# Patient Record
Sex: Female | Born: 1950 | Race: Black or African American | Hispanic: No | Marital: Married | Smoking: Never smoker
Health system: Southern US, Community
[De-identification: ages and names within clinical notes are randomized; demographics above are authoritative.]

## PROBLEM LIST (undated history)

## (undated) ENCOUNTER — Emergency Department: Disposition: A | Discharge status: Home / Self Care | Payer: Medicare Other | Source: Home / Self Care

---

## 1999-07-24 ENCOUNTER — Encounter (INDEPENDENT_AMBULATORY_CARE_PROVIDER_SITE_OTHER): Payer: Self-pay

## 1999-07-24 ENCOUNTER — Other Ambulatory Visit: Admission: RE | Admit: 1999-07-24 | Discharge: 1999-07-24 | Payer: Self-pay | Admitting: Obstetrics and Gynecology

## 2001-09-23 ENCOUNTER — Encounter: Payer: Self-pay | Admitting: Nephrology

## 2001-09-23 ENCOUNTER — Encounter: Admission: RE | Admit: 2001-09-23 | Discharge: 2001-09-23 | Payer: Self-pay | Admitting: Nephrology

## 2001-10-19 ENCOUNTER — Ambulatory Visit (HOSPITAL_COMMUNITY): Admission: RE | Admit: 2001-10-19 | Discharge: 2001-10-19 | Payer: Self-pay | Admitting: Cardiology

## 2001-10-19 ENCOUNTER — Encounter (INDEPENDENT_AMBULATORY_CARE_PROVIDER_SITE_OTHER): Payer: Self-pay | Admitting: *Deleted

## 2001-10-19 ENCOUNTER — Encounter: Payer: Self-pay | Admitting: Nephrology

## 2003-05-22 ENCOUNTER — Ambulatory Visit (HOSPITAL_COMMUNITY): Admission: RE | Admit: 2003-05-22 | Discharge: 2003-05-22 | Payer: Self-pay

## 2003-11-05 ENCOUNTER — Encounter: Admission: RE | Admit: 2003-11-05 | Discharge: 2003-11-05 | Payer: Self-pay | Admitting: Nephrology

## 2003-11-26 ENCOUNTER — Encounter (INDEPENDENT_AMBULATORY_CARE_PROVIDER_SITE_OTHER): Payer: Self-pay | Admitting: Specialist

## 2003-11-26 ENCOUNTER — Ambulatory Visit (HOSPITAL_COMMUNITY): Admission: RE | Admit: 2003-11-26 | Discharge: 2003-11-26 | Payer: Self-pay | Admitting: Gastroenterology

## 2005-05-14 IMAGING — CR DG HIP COMPLETE 2+V*R*
3 series · 3 of 3 positions shown · non-contrast
Comparison: none

CLINICAL DATA: Back and right hip pain. 
 LUMBAR SPINE COMPLETE AND RIGHT HIP COMPLETE 
 RIGHT HIP
 AP view of the pelvis with AP and frog-leg coned-down views of the right hip. 
 No fractures or acute changes.  No degenerative changes of the hips.  Mild degenerative changes of the SI joints. 
 IMPRESSION
 1.  The right hip is normal. 
 2.  There are some degenerative changes of the SI joint. 
 LUMBAR SPINE 
 There is a transitional lumbosacral junction with asymmetrical sacralization on the left side.  No significant disc narrowing.  Mild degenerative changes of the facets.  Soft tissues normal.
 Transitional lumbosacral junction and mild degenerative changes ? no acute abnormality.

[view not recorded (1 of 3)]
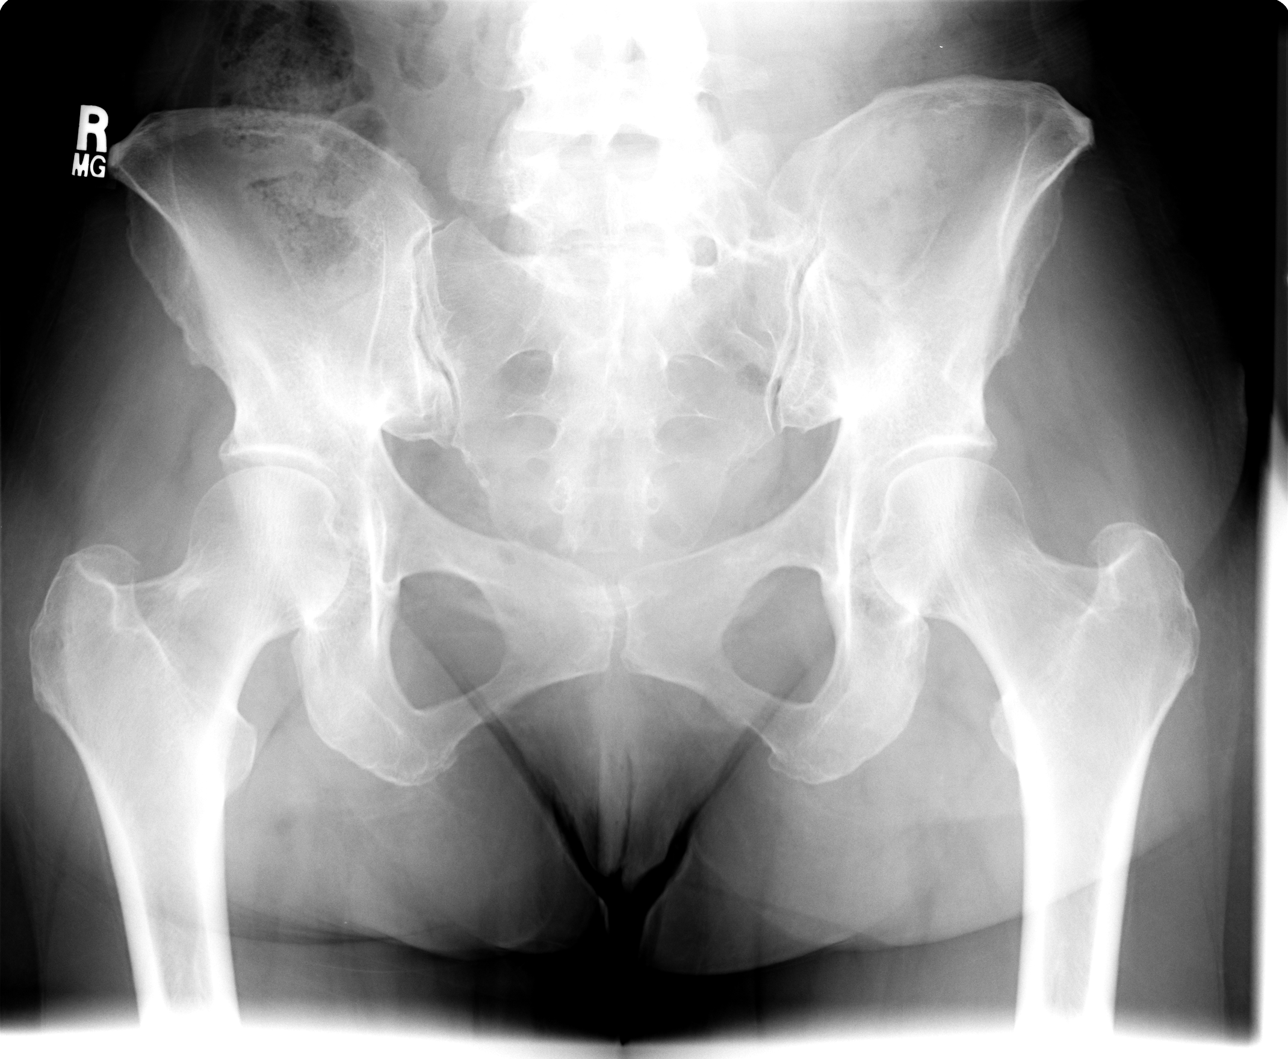

[view not recorded (2 of 3)]
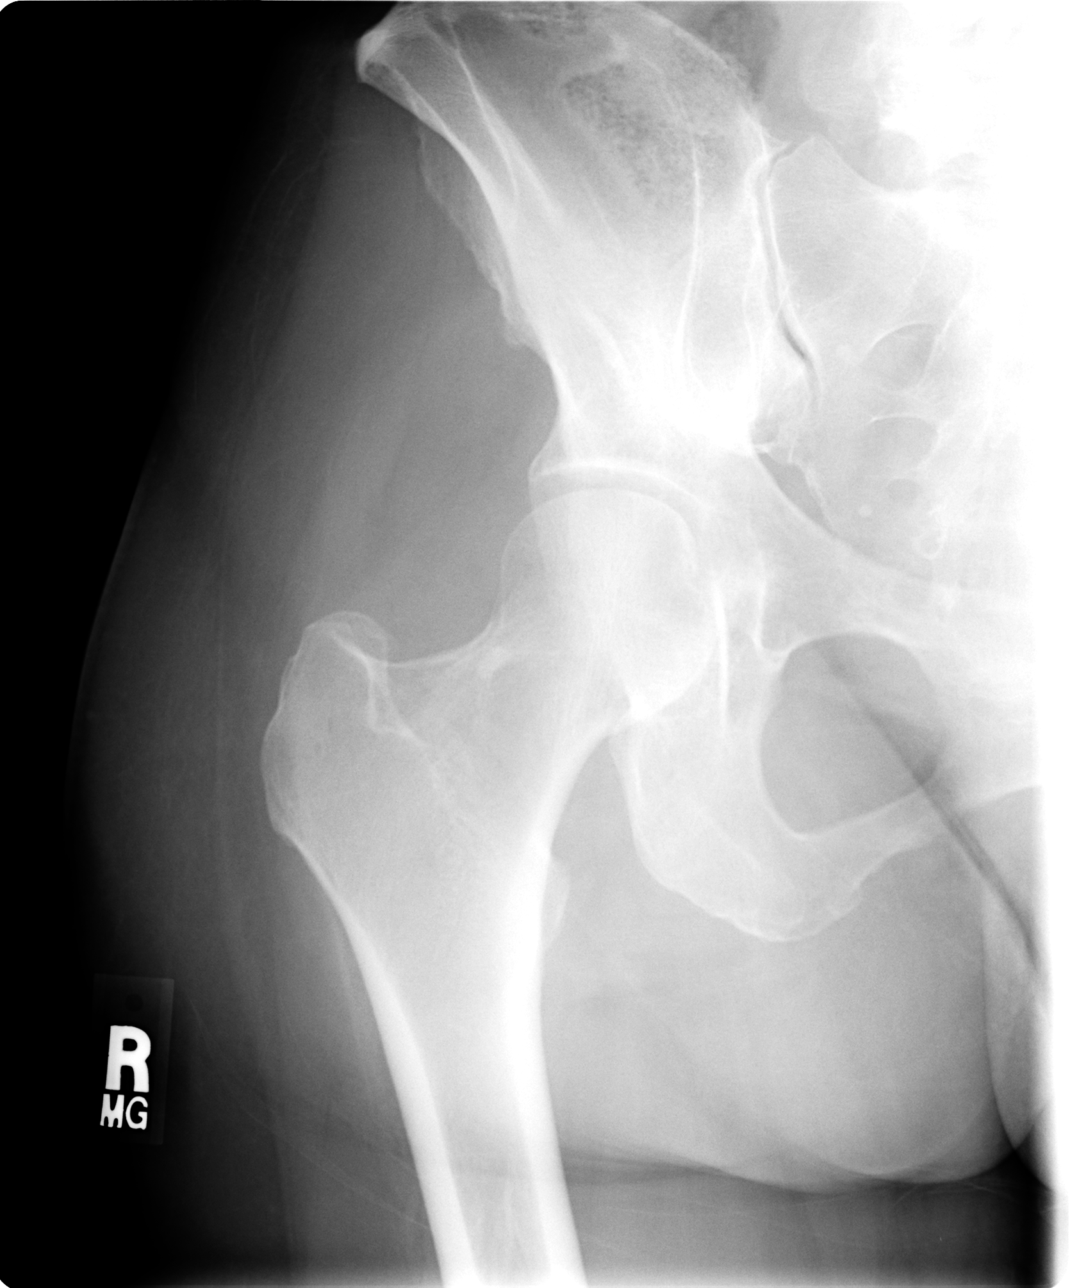

[view not recorded (3 of 3)]
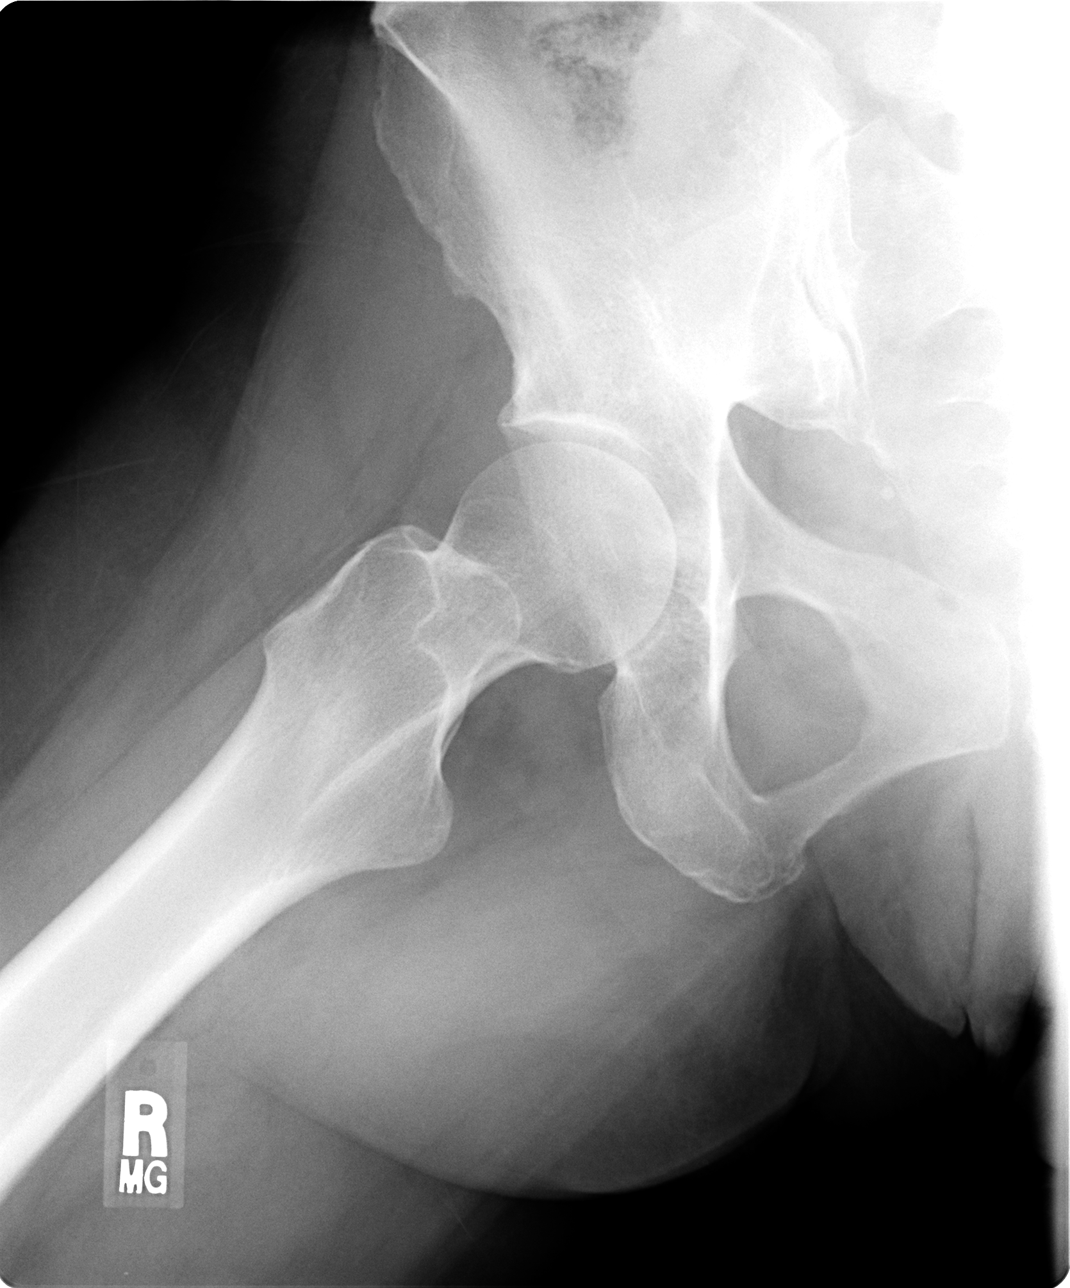

[3 of 3 positions shown; findings below may reference images not displayed]

## 2007-10-24 ENCOUNTER — Encounter: Admission: RE | Admit: 2007-10-24 | Discharge: 2007-10-24 | Payer: Self-pay | Admitting: Otolaryngology

## 2010-10-31 NOTE — Op Note (Signed)
NAME:  Autumn Ramirez, Autumn Ramirez                        ACCOUNT NO.:  [id number]   MEDICAL RECORD NO.:  [id number]                   PATIENT TYPE:  AMB   LOCATION:  ENDO                                 FACILITY:  [facility]   PHYSICIAN:  James L. Malon Kindle., M.D.          DATE OF BIRTH:  Oct 20, 1950   DATE OF PROCEDURE:  11/26/2003  DATE OF DISCHARGE:                                 OPERATIVE REPORT   PROCEDURE:  Colonoscopy and polypectomy.   MEDICATIONS:  1. Fentanyl 100 mcg.  2. Versed 8 mg IV.   SCOPE:  Olympus pediatric adjustable colonoscope.   DESCRIPTION OF PROCEDURE:  The procedure had been explained to the patient  and consent obtained.  With the patient in the left lateral decubitus  position, the Olympus scope was inserted and advanced.  The pediatric  adjustable scope was used.  We were able to advance easily to the cecum.  Ileocecal valve and appendiceal orifice were seen.  The scope was withdrawn,  and the cecum, ascending colon, transverse colon, splenic flexure,  descending, and sigmoid colon were seen well.  At 30-35 cm from the anal  verge, a 1.5 cm pedunculated polyp was encountered.  This was removed with  the snare and sucked up against the scope and removed.  I reinserted the  scope back up to that site and withdrew.  No other polyps were seen.  There  was no significant diverticular disease.  The scope was withdrawn.  The  patient tolerated the procedure well.   ASSESSMENT:  Sigmoid colon polyp, removed.  211.3.   PLAN:  1. Routine postpolypectomy instructions.  2. We will recommend repeating in 3 years.                                               James L. Malon Kindle., M.D.    Waldron Session  D:  11/26/2003  T:  11/26/2003  Job:  [id number]   cc:   Jarome Matin, M.D.  [address] [place]  [place] [address]  Fax: [phone]   Lenoard Aden, M.D.  [address]  [place]  [place] [address]  Fax: [phone]

## 2014-12-23 ENCOUNTER — Ambulatory Visit (INDEPENDENT_AMBULATORY_CARE_PROVIDER_SITE_OTHER): Payer: Medicare HMO | Admitting: Emergency Medicine

## 2014-12-23 VITALS — BP 130/80 | HR 63 | Temp 98.3°F | Resp 16 | Ht 65.5 in | Wt 143.2 lb

## 2014-12-23 DIAGNOSIS — S70361A Insect bite (nonvenomous), right thigh, initial encounter: Secondary | ICD-10-CM

## 2014-12-23 DIAGNOSIS — T63481A Toxic effect of venom of other arthropod, accidental (unintentional), initial encounter: Secondary | ICD-10-CM

## 2014-12-23 MED ORDER — TRIAMCINOLONE ACETONIDE 0.1 % EX CREA: 1.0000 "application " | TOPICAL_CREAM | Freq: Two times a day (BID) | CUTANEOUS | Status: AC

## 2014-12-23 MED ORDER — SULFAMETHOXAZOLE-TRIMETHOPRIM 800-160 MG PO TABS: 1.0000 | ORAL_TABLET | Freq: Two times a day (BID) | ORAL | Status: AC

## 2014-12-23 NOTE — Progress Notes (Signed)
Subjective:  Patient ID: Autumn Ramirez, female    DOB: [birth year]  Age: 64 y.o. MRN: [id number]  CC: Insect Bite and Rash   HPI [name] presents  with a 2 day history of redness and swelling in the medial thigh. His bitten by a an insect 2 days ago and has increasing redness swelling and itching in her right medial thigh. She has no shortness breath wheezing or difficulty swallowing or breathing. Has no cough or coryza. No fever or chills. No nausea vomiting. No known insect allergy. She is not improved on over-the-counter medication  History [name] has no past medical history on file.   She has no past surgical history on file.   Her  family history is not on file.  She   reports that she has never smoked. She does not have any smokeless tobacco history on file. Her alcohol and drug histories are not on file.  No outpatient prescriptions prior to visit.   No facility-administered medications prior to visit.    History   Social History  . Marital Status: Married    Spouse Name: N/A  . Number of Children: N/A  . Years of Education: N/A   Social History Main Topics  . Smoking status: Never Smoker   . Smokeless tobacco: Not on file  . Alcohol Use: Not on file  . Drug Use: Not on file  . Sexual Activity: Not on file   Other Topics Concern  . None   Social History Narrative  . None     Review of Systems  Constitutional: Negative for fever, chills and appetite change.  HENT: Negative for congestion, ear pain, postnasal drip, sinus pressure and sore throat.   Eyes: Negative for pain and redness.  Respiratory: Negative for cough, shortness of breath and wheezing.   Cardiovascular: Negative for leg swelling.  Gastrointestinal: Negative for nausea, vomiting, abdominal pain, diarrhea, constipation and blood in stool.  Endocrine: Negative for polyuria.  Genitourinary: Negative for dysuria, urgency, frequency and flank pain.  Musculoskeletal: Negative for  gait problem.  Skin: Negative for rash.  Neurological: Negative for weakness and headaches.  Psychiatric/Behavioral: Negative for confusion and decreased concentration. The patient is not nervous/anxious.     Objective:  BP 130/80 mmHg  Pulse 63  Temp(Src) 98.3 F (36.8 C) (Oral)  Resp 16  Ht 5' 5.5" (1.664 m)  Wt 143 lb 3.2 oz (64.955 kg)  BMI 23.46 kg/m2  SpO2 97%  Physical Exam  Constitutional: She is oriented to person, place, and time. She appears well-developed and well-nourished. No distress.  HENT:  Head: Normocephalic and atraumatic.  Right Ear: External ear normal.  Left Ear: External ear normal.  Nose: Nose normal.  Eyes: Conjunctivae and EOM are normal. Pupils are equal, round, and reactive to light. No scleral icterus.  Neck: Normal range of motion. Neck supple. No tracheal deviation present.  Cardiovascular: Normal rate, regular rhythm and normal heart sounds.   Pulmonary/Chest: Effort normal. No respiratory distress. She has no wheezes. She has no rales.  Abdominal: She exhibits no mass. There is no tenderness. There is no rebound and no guarding.  Musculoskeletal: She exhibits no edema.  Lymphadenopathy:    She has no cervical adenopathy.  Neurological: She is alert and oriented to person, place, and time. Coordination normal.  Skin: Skin is warm and dry. No rash noted.  Psychiatric: She has a normal mood and affect. Her behavior is normal.   she has an erythematous area proximally  6 inches diameter right medial distal thigh. There is no lymphangitis or lymphadenopathy    Assessment & Plan:   Claris CheMargaret was seen today for insect bite and rash.  Diagnoses and all orders for this visit:  Insect sting, accidental or unintentional, initial encounter  Other orders -     sulfamethoxazole-trimethoprim (BACTRIM DS,SEPTRA DS) 800-160 MG per tablet; Take 1 tablet by mouth 2 (two) times daily. -     triamcinolone cream (KENALOG) 0.1 %; Apply 1 application topically  2 (two) times daily.   I am having Ms. Kobus start on sulfamethoxazole-trimethoprim and triamcinolone cream.  Meds ordered this encounter  Medications  . sulfamethoxazole-trimethoprim (BACTRIM DS,SEPTRA DS) 800-160 MG per tablet    Sig: Take 1 tablet by mouth 2 (two) times daily.    Dispense:  20 tablet    Refill:  0  . triamcinolone cream (KENALOG) 0.1 %    Sig: Apply 1 application topically 2 (two) times daily.    Dispense:  30 g    Refill:  0    Appropriate red flag conditions were discussed with the patient as well as actions that should be taken.  Patient expressed his understanding.  Follow-up: Return if symptoms worsen or fail to improve.  Carmelina DaneAnderson, Fay Swider S, MD

## 2014-12-23 NOTE — Patient Instructions (Signed)

## 2015-08-23 ENCOUNTER — Other Ambulatory Visit: Payer: Self-pay | Admitting: Gastroenterology

## 2015-10-25 ENCOUNTER — Other Ambulatory Visit: Payer: Self-pay | Admitting: Internal Medicine

## 2015-10-25 DIAGNOSIS — E2839 Other primary ovarian failure: Secondary | ICD-10-CM

## 2015-11-13 ENCOUNTER — Ambulatory Visit
Admission: RE | Admit: 2015-11-13 | Discharge: 2015-11-13 | Disposition: A | Discharge status: Home / Self Care | Payer: Medicare HMO | Source: Ambulatory Visit | Attending: Internal Medicine | Admitting: Internal Medicine

## 2015-11-13 DIAGNOSIS — E2839 Other primary ovarian failure: Secondary | ICD-10-CM

## 2018-04-01 ENCOUNTER — Other Ambulatory Visit: Payer: Self-pay | Admitting: Internal Medicine

## 2018-04-01 DIAGNOSIS — E2839 Other primary ovarian failure: Secondary | ICD-10-CM

## 2018-05-24 ENCOUNTER — Ambulatory Visit
Admission: RE | Admit: 2018-05-24 | Discharge: 2018-05-24 | Disposition: A | Discharge status: Home / Self Care | Payer: Medicare HMO | Source: Ambulatory Visit | Attending: Internal Medicine | Admitting: Internal Medicine

## 2018-05-24 DIAGNOSIS — E2839 Other primary ovarian failure: Secondary | ICD-10-CM

## 2019-03-04 ENCOUNTER — Other Ambulatory Visit: Payer: Self-pay

## 2019-03-04 DIAGNOSIS — Z20822 Contact with and (suspected) exposure to covid-19: Secondary | ICD-10-CM

## 2019-03-05 LAB — NOVEL CORONAVIRUS, NAA: SARS-CoV-2, NAA: NOT DETECTED

## 2020-05-31 ENCOUNTER — Other Ambulatory Visit: Payer: Self-pay | Admitting: Internal Medicine

## 2020-05-31 DIAGNOSIS — E2839 Other primary ovarian failure: Secondary | ICD-10-CM

## 2021-04-30 DIAGNOSIS — H35033 Hypertensive retinopathy, bilateral: Secondary | ICD-10-CM | POA: Diagnosis not present

## 2021-04-30 DIAGNOSIS — H2513 Age-related nuclear cataract, bilateral: Secondary | ICD-10-CM | POA: Diagnosis not present

## 2021-04-30 DIAGNOSIS — H04123 Dry eye syndrome of bilateral lacrimal glands: Secondary | ICD-10-CM | POA: Diagnosis not present

## 2021-04-30 DIAGNOSIS — H25013 Cortical age-related cataract, bilateral: Secondary | ICD-10-CM | POA: Diagnosis not present

## 2021-05-29 ENCOUNTER — Other Ambulatory Visit: Payer: Self-pay | Admitting: Internal Medicine

## 2021-05-29 DIAGNOSIS — R7303 Prediabetes: Secondary | ICD-10-CM | POA: Diagnosis not present

## 2021-05-29 DIAGNOSIS — Z0000 Encounter for general adult medical examination without abnormal findings: Secondary | ICD-10-CM | POA: Diagnosis not present

## 2021-05-29 DIAGNOSIS — Z13220 Encounter for screening for lipoid disorders: Secondary | ICD-10-CM | POA: Diagnosis not present

## 2021-05-29 DIAGNOSIS — I10 Essential (primary) hypertension: Secondary | ICD-10-CM | POA: Diagnosis not present

## 2021-05-29 DIAGNOSIS — J343 Hypertrophy of nasal turbinates: Secondary | ICD-10-CM | POA: Diagnosis not present

## 2021-05-30 LAB — COMPLETE METABOLIC PANEL WITH GFR
AG Ratio: 1.4 (calc) (ref 1.0–2.5)
ALT: 14 U/L (ref 6–29)
AST: 19 U/L (ref 10–35)
Albumin: 4.3 g/dL (ref 3.6–5.1)
Alkaline phosphatase (APISO): 63 U/L (ref 37–153)
BUN: 9 mg/dL (ref 7–25)
CO2: 29 mmol/L (ref 20–32)
Calcium: 9.6 mg/dL (ref 8.6–10.4)
Chloride: 102 mmol/L (ref 98–110)
Creat: 0.77 mg/dL (ref 0.60–1.00)
Globulin: 3.1 g/dL (calc) (ref 1.9–3.7)
Glucose, Bld: 80 mg/dL (ref 65–99)
Potassium: 4 mmol/L (ref 3.5–5.3)
Sodium: 139 mmol/L (ref 135–146)
Total Bilirubin: 0.6 mg/dL (ref 0.2–1.2)
Total Protein: 7.4 g/dL (ref 6.1–8.1)
eGFR: 82 mL/min/{1.73_m2} (ref >=60)

## 2021-05-30 LAB — LIPID PANEL
Cholesterol: 201 mg/dL — ABNORMAL HIGH (ref <200)
HDL: 67 mg/dL (ref >=50)
LDL Cholesterol (Calc): 118 mg/dL (calc) — ABNORMAL HIGH
Non-HDL Cholesterol (Calc): 134 mg/dL (calc) — ABNORMAL HIGH (ref <130)
Total CHOL/HDL Ratio: 3 (calc) (ref <5.0)
Triglycerides: 64 mg/dL (ref <150)

## 2021-05-30 LAB — CBC
HCT: 38.7 % (ref 35.0–45.0)
Hemoglobin: 12.8 g/dL (ref 11.7–15.5)
MCH: 30 pg (ref 27.0–33.0)
MCHC: 33.1 g/dL (ref 32.0–36.0)
MCV: 90.6 fL (ref 80.0–100.0)
MPV: 9.8 fL (ref 7.5–12.5)
Platelets: 275 10*3/uL (ref 140–400)
RBC: 4.27 10*6/uL (ref 3.80–5.10)
RDW: 12.3 % (ref 11.0–15.0)
WBC: 4.4 10*3/uL (ref 3.8–10.8)

## 2021-05-30 LAB — TSH: TSH: 0.85 mIU/L (ref 0.40–4.50)

## 2021-05-30 LAB — CLIENT EDUCATION TRACKING

## 2021-06-03 ENCOUNTER — Other Ambulatory Visit: Payer: Self-pay | Admitting: Internal Medicine

## 2021-06-03 DIAGNOSIS — E2839 Other primary ovarian failure: Secondary | ICD-10-CM

## 2021-06-25 DIAGNOSIS — Z1231 Encounter for screening mammogram for malignant neoplasm of breast: Secondary | ICD-10-CM | POA: Diagnosis not present

## 2021-07-02 DIAGNOSIS — Z86010 Personal history of colonic polyps: Secondary | ICD-10-CM | POA: Diagnosis not present

## 2021-07-02 DIAGNOSIS — K648 Other hemorrhoids: Secondary | ICD-10-CM | POA: Diagnosis not present

## 2021-07-02 DIAGNOSIS — D120 Benign neoplasm of cecum: Secondary | ICD-10-CM | POA: Diagnosis not present

## 2021-07-02 DIAGNOSIS — K5730 Diverticulosis of large intestine without perforation or abscess without bleeding: Secondary | ICD-10-CM | POA: Diagnosis not present

## 2021-07-04 DIAGNOSIS — D120 Benign neoplasm of cecum: Secondary | ICD-10-CM | POA: Diagnosis not present

## 2021-07-10 ENCOUNTER — Ambulatory Visit
Admission: RE | Admit: 2021-07-10 | Discharge: 2021-07-10 | Disposition: A | Discharge status: Home / Self Care | Payer: Medicare Other | Source: Ambulatory Visit | Attending: Internal Medicine | Admitting: Internal Medicine

## 2021-07-10 DIAGNOSIS — M810 Age-related osteoporosis without current pathological fracture: Secondary | ICD-10-CM | POA: Diagnosis not present

## 2021-07-10 DIAGNOSIS — Z780 Asymptomatic menopausal state: Secondary | ICD-10-CM | POA: Diagnosis not present

## 2021-07-10 DIAGNOSIS — E2839 Other primary ovarian failure: Secondary | ICD-10-CM

## 2021-07-10 DIAGNOSIS — M8588 Other specified disorders of bone density and structure, other site: Secondary | ICD-10-CM | POA: Diagnosis not present

## 2021-12-04 DIAGNOSIS — M810 Age-related osteoporosis without current pathological fracture: Secondary | ICD-10-CM | POA: Diagnosis not present

## 2021-12-04 DIAGNOSIS — R7303 Prediabetes: Secondary | ICD-10-CM | POA: Diagnosis not present

## 2021-12-04 DIAGNOSIS — Z0000 Encounter for general adult medical examination without abnormal findings: Secondary | ICD-10-CM | POA: Diagnosis not present

## 2021-12-04 DIAGNOSIS — I10 Essential (primary) hypertension: Secondary | ICD-10-CM | POA: Diagnosis not present

## 2022-06-11 DIAGNOSIS — J069 Acute upper respiratory infection, unspecified: Secondary | ICD-10-CM | POA: Diagnosis not present

## 2022-06-11 DIAGNOSIS — R6889 Other general symptoms and signs: Secondary | ICD-10-CM | POA: Diagnosis not present

## 2022-06-11 DIAGNOSIS — R0982 Postnasal drip: Secondary | ICD-10-CM | POA: Diagnosis not present

## 2022-06-11 DIAGNOSIS — Z1152 Encounter for screening for COVID-19: Secondary | ICD-10-CM | POA: Diagnosis not present

## 2022-06-18 DIAGNOSIS — K080 Exfoliation of teeth due to systemic causes: Secondary | ICD-10-CM | POA: Diagnosis not present

## 2022-06-26 DIAGNOSIS — K080 Exfoliation of teeth due to systemic causes: Secondary | ICD-10-CM | POA: Diagnosis not present

## 2022-06-30 DIAGNOSIS — R92333 Mammographic heterogeneous density, bilateral breasts: Secondary | ICD-10-CM | POA: Diagnosis not present

## 2022-06-30 DIAGNOSIS — Z1231 Encounter for screening mammogram for malignant neoplasm of breast: Secondary | ICD-10-CM | POA: Diagnosis not present

## 2022-07-07 DIAGNOSIS — R7303 Prediabetes: Secondary | ICD-10-CM | POA: Diagnosis not present

## 2022-07-07 DIAGNOSIS — I10 Essential (primary) hypertension: Secondary | ICD-10-CM | POA: Diagnosis not present

## 2022-07-07 DIAGNOSIS — M810 Age-related osteoporosis without current pathological fracture: Secondary | ICD-10-CM | POA: Diagnosis not present

## 2022-07-31 DIAGNOSIS — K080 Exfoliation of teeth due to systemic causes: Secondary | ICD-10-CM | POA: Diagnosis not present

## 2022-08-13 DIAGNOSIS — H524 Presbyopia: Secondary | ICD-10-CM | POA: Diagnosis not present

## 2022-08-13 DIAGNOSIS — H35363 Drusen (degenerative) of macula, bilateral: Secondary | ICD-10-CM | POA: Diagnosis not present

## 2022-08-13 DIAGNOSIS — H35033 Hypertensive retinopathy, bilateral: Secondary | ICD-10-CM | POA: Diagnosis not present

## 2022-08-13 DIAGNOSIS — H25813 Combined forms of age-related cataract, bilateral: Secondary | ICD-10-CM | POA: Diagnosis not present

## 2022-08-13 DIAGNOSIS — H04123 Dry eye syndrome of bilateral lacrimal glands: Secondary | ICD-10-CM | POA: Diagnosis not present

## 2022-10-14 DIAGNOSIS — S80861D Insect bite (nonvenomous), right lower leg, subsequent encounter: Secondary | ICD-10-CM | POA: Diagnosis not present

## 2022-10-14 DIAGNOSIS — L03115 Cellulitis of right lower limb: Secondary | ICD-10-CM | POA: Diagnosis not present

## 2022-10-27 DIAGNOSIS — L03115 Cellulitis of right lower limb: Secondary | ICD-10-CM | POA: Diagnosis not present

## 2022-12-29 ENCOUNTER — Other Ambulatory Visit: Payer: Self-pay | Admitting: Internal Medicine

## 2022-12-29 DIAGNOSIS — Z0000 Encounter for general adult medical examination without abnormal findings: Secondary | ICD-10-CM | POA: Diagnosis not present

## 2022-12-29 DIAGNOSIS — I10 Essential (primary) hypertension: Secondary | ICD-10-CM | POA: Diagnosis not present

## 2022-12-29 DIAGNOSIS — R7303 Prediabetes: Secondary | ICD-10-CM | POA: Diagnosis not present

## 2022-12-29 DIAGNOSIS — Z13220 Encounter for screening for lipoid disorders: Secondary | ICD-10-CM | POA: Diagnosis not present

## 2022-12-30 LAB — COMPLETE METABOLIC PANEL WITH GFR
AG Ratio: 1.6 (calc) (ref 1.0–2.5)
ALT: 13 U/L (ref 6–29)
AST: 16 U/L (ref 10–35)
Albumin: 4.5 g/dL (ref 3.6–5.1)
Alkaline phosphatase (APISO): 58 U/L (ref 37–153)
BUN: 9 mg/dL (ref 7–25)
CO2: 27 mmol/L (ref 20–32)
Calcium: 9.7 mg/dL (ref 8.6–10.4)
Chloride: 102 mmol/L (ref 98–110)
Creat: 0.66 mg/dL (ref 0.60–1.00)
Globulin: 2.9 g/dL (calc) (ref 1.9–3.7)
Glucose, Bld: 76 mg/dL (ref 65–99)
Potassium: 3.9 mmol/L (ref 3.5–5.3)
Sodium: 138 mmol/L (ref 135–146)
Total Bilirubin: 0.8 mg/dL (ref 0.2–1.2)
Total Protein: 7.4 g/dL (ref 6.1–8.1)
eGFR: 93 mL/min/{1.73_m2} (ref >=60)

## 2022-12-30 LAB — LIPID PANEL
Cholesterol: 178 mg/dL (ref <200)
HDL: 75 mg/dL (ref >=50)
LDL Cholesterol (Calc): 83 mg/dL (calc)
Non-HDL Cholesterol (Calc): 103 mg/dL (calc) (ref <130)
Total CHOL/HDL Ratio: 2.4 (calc) (ref <5.0)
Triglycerides: 107 mg/dL (ref <150)

## 2022-12-30 LAB — CBC
HCT: 38.3 % (ref 35.0–45.0)
Hemoglobin: 12.4 g/dL (ref 11.7–15.5)
MCH: 29.7 pg (ref 27.0–33.0)
MCHC: 32.4 g/dL (ref 32.0–36.0)
MCV: 91.8 fL (ref 80.0–100.0)
MPV: 10.2 fL (ref 7.5–12.5)
Platelets: 208 10*3/uL (ref 140–400)
RBC: 4.17 10*6/uL (ref 3.80–5.10)
RDW: 12.3 % (ref 11.0–15.0)
WBC: 4.3 10*3/uL (ref 3.8–10.8)

## 2022-12-30 LAB — TSH: TSH: 0.89 mIU/L (ref 0.40–4.50)

## 2023-02-11 DIAGNOSIS — G620 Drug-induced polyneuropathy: Secondary | ICD-10-CM | POA: Diagnosis not present

## 2023-02-26 DIAGNOSIS — K080 Exfoliation of teeth due to systemic causes: Secondary | ICD-10-CM | POA: Diagnosis not present

## 2023-07-15 DIAGNOSIS — R92323 Mammographic fibroglandular density, bilateral breasts: Secondary | ICD-10-CM | POA: Diagnosis not present

## 2023-07-15 DIAGNOSIS — Z1231 Encounter for screening mammogram for malignant neoplasm of breast: Secondary | ICD-10-CM | POA: Diagnosis not present

## 2023-07-22 DIAGNOSIS — I10 Essential (primary) hypertension: Secondary | ICD-10-CM | POA: Diagnosis not present

## 2023-07-22 DIAGNOSIS — J069 Acute upper respiratory infection, unspecified: Secondary | ICD-10-CM | POA: Diagnosis not present

## 2023-07-22 DIAGNOSIS — R7303 Prediabetes: Secondary | ICD-10-CM | POA: Diagnosis not present

## 2023-07-22 DIAGNOSIS — Z0120 Encounter for dental examination and cleaning without abnormal findings: Secondary | ICD-10-CM | POA: Diagnosis not present

## 2023-08-20 DIAGNOSIS — H04123 Dry eye syndrome of bilateral lacrimal glands: Secondary | ICD-10-CM | POA: Diagnosis not present

## 2023-08-20 DIAGNOSIS — H35033 Hypertensive retinopathy, bilateral: Secondary | ICD-10-CM | POA: Diagnosis not present

## 2023-08-20 DIAGNOSIS — H40053 Ocular hypertension, bilateral: Secondary | ICD-10-CM | POA: Diagnosis not present

## 2023-08-20 DIAGNOSIS — H25813 Combined forms of age-related cataract, bilateral: Secondary | ICD-10-CM | POA: Diagnosis not present

## 2023-08-20 DIAGNOSIS — H524 Presbyopia: Secondary | ICD-10-CM | POA: Diagnosis not present

## 2023-09-20 DIAGNOSIS — H40053 Ocular hypertension, bilateral: Secondary | ICD-10-CM | POA: Diagnosis not present

## 2023-10-20 DIAGNOSIS — H40053 Ocular hypertension, bilateral: Secondary | ICD-10-CM | POA: Diagnosis not present

## 2023-11-19 DIAGNOSIS — H259 Unspecified age-related cataract: Secondary | ICD-10-CM | POA: Diagnosis not present

## 2024-01-19 DIAGNOSIS — K080 Exfoliation of teeth due to systemic causes: Secondary | ICD-10-CM | POA: Diagnosis not present

## 2024-02-25 DIAGNOSIS — H40053 Ocular hypertension, bilateral: Secondary | ICD-10-CM | POA: Diagnosis not present

## 2024-04-07 DIAGNOSIS — K080 Exfoliation of teeth due to systemic causes: Secondary | ICD-10-CM | POA: Diagnosis not present

## 2024-05-30 DIAGNOSIS — K080 Exfoliation of teeth due to systemic causes: Secondary | ICD-10-CM | POA: Diagnosis not present
